# Patient Record
Sex: Female | Born: 1960 | Race: White | Hispanic: No | Smoking: Never smoker
Health system: Southern US, Community
[De-identification: ages and names within clinical notes are randomized; demographics above are authoritative.]

## PROBLEM LIST (undated history)

## (undated) DIAGNOSIS — R011 Cardiac murmur, unspecified: Secondary | ICD-10-CM

## (undated) DIAGNOSIS — H25813 Combined forms of age-related cataract, bilateral: Secondary | ICD-10-CM

## (undated) DIAGNOSIS — H35341 Macular cyst, hole, or pseudohole, right eye: Secondary | ICD-10-CM

## (undated) DIAGNOSIS — N281 Cyst of kidney, acquired: Secondary | ICD-10-CM

## (undated) DIAGNOSIS — C50911 Malignant neoplasm of unspecified site of right female breast: Secondary | ICD-10-CM

## (undated) DIAGNOSIS — E041 Nontoxic single thyroid nodule: Secondary | ICD-10-CM

## (undated) DIAGNOSIS — K219 Gastro-esophageal reflux disease without esophagitis: Secondary | ICD-10-CM

## (undated) DIAGNOSIS — R7989 Other specified abnormal findings of blood chemistry: Secondary | ICD-10-CM

## (undated) DIAGNOSIS — H43813 Vitreous degeneration, bilateral: Secondary | ICD-10-CM

## (undated) DIAGNOSIS — I341 Nonrheumatic mitral (valve) prolapse: Secondary | ICD-10-CM

## (undated) DIAGNOSIS — T8859XA Other complications of anesthesia, initial encounter: Secondary | ICD-10-CM

## (undated) DIAGNOSIS — A6920 Lyme disease, unspecified: Secondary | ICD-10-CM

## (undated) DIAGNOSIS — H35371 Puckering of macula, right eye: Secondary | ICD-10-CM

## (undated) HISTORY — DX: Malignant neoplasm of unspecified site of right female breast: C50.911

## (undated) HISTORY — PX: PARS PLANA VITRECTOMY: SHX2166

## (undated) HISTORY — DX: Gastro-esophageal reflux disease without esophagitis: K21.9

## (undated) HISTORY — PX: ESOPHAGOGASTRODUODENOSCOPY: SHX1529

## (undated) HISTORY — DX: Nonrheumatic mitral (valve) prolapse: I34.1

## (undated) HISTORY — DX: Lyme disease, unspecified: A69.20

## (undated) HISTORY — DX: Other complications of anesthesia, initial encounter: T88.59XA

## (undated) HISTORY — DX: Other specified abnormal findings of blood chemistry: R79.89

## (undated) HISTORY — PX: CATARACT EXTRACTION W/ INTRAOCULAR LENS IMPLANT: SHX1309

## (undated) HISTORY — PX: BREAST SURGERY: SHX581

## (undated) HISTORY — DX: Vitreous degeneration, bilateral: H43.813

## (undated) HISTORY — DX: Puckering of macula, right eye: H35.371

## (undated) HISTORY — PX: BREAST BIOPSY: SHX20

## (undated) HISTORY — DX: Combined forms of age-related cataract, bilateral: H25.813

## (undated) HISTORY — PX: MYOMECTOMY: SHX85

## (undated) HISTORY — DX: Cyst of kidney, acquired: N28.1

## (undated) HISTORY — PX: OTHER SURGICAL HISTORY: SHX169

## (undated) HISTORY — DX: Macular cyst, hole, or pseudohole, right eye: H35.341

## (undated) HISTORY — PX: HYSTEROSCOPY WITH D & C: SHX1775

## (undated) HISTORY — DX: Nontoxic single thyroid nodule: E04.1

## (undated) HISTORY — PX: MEMBRANECTOMY: SHX2029

## (undated) HISTORY — PX: COLPOSCOPY: SHX161

## (undated) HISTORY — DX: Cardiac murmur, unspecified: R01.1

## (undated) HISTORY — PX: BREAST EXCISIONAL BIOPSY: SUR124

## (undated) HISTORY — PX: NECK MASS EXCISION: SHX2079

---

## 2017-11-24 ENCOUNTER — Other Ambulatory Visit: Payer: Self-pay | Admitting: Family Medicine

## 2017-11-24 DIAGNOSIS — R928 Other abnormal and inconclusive findings on diagnostic imaging of breast: Secondary | ICD-10-CM

## 2017-12-05 ENCOUNTER — Ambulatory Visit
Admission: RE | Admit: 2017-12-05 | Discharge: 2017-12-05 | Disposition: A | Discharge status: Home / Self Care | Payer: 59 | Source: Ambulatory Visit | Attending: Family Medicine | Admitting: Family Medicine

## 2017-12-05 DIAGNOSIS — R928 Other abnormal and inconclusive findings on diagnostic imaging of breast: Secondary | ICD-10-CM

## 2017-12-26 HISTORY — PX: BREAST LUMPECTOMY: SHX2

## 2018-01-23 HISTORY — PX: BREAST LUMPECTOMY: SHX2

## 2023-01-04 ENCOUNTER — Inpatient Hospital Stay: Payer: Medicaid Other

## 2023-01-04 ENCOUNTER — Encounter: Payer: Self-pay | Admitting: Oncology

## 2023-01-04 ENCOUNTER — Inpatient Hospital Stay: Payer: Medicaid Other | Attending: Oncology | Admitting: Oncology

## 2023-01-04 VITALS — BP 146/93 | HR 71 | Temp 98.3°F | Resp 14 | Ht 65.0 in | Wt 221.5 lb

## 2023-01-04 DIAGNOSIS — Z803 Family history of malignant neoplasm of breast: Secondary | ICD-10-CM | POA: Insufficient documentation

## 2023-01-04 DIAGNOSIS — K219 Gastro-esophageal reflux disease without esophagitis: Secondary | ICD-10-CM | POA: Diagnosis not present

## 2023-01-04 DIAGNOSIS — Z923 Personal history of irradiation: Secondary | ICD-10-CM

## 2023-01-04 DIAGNOSIS — D0511 Intraductal carcinoma in situ of right breast: Secondary | ICD-10-CM

## 2023-01-04 DIAGNOSIS — Z806 Family history of leukemia: Secondary | ICD-10-CM | POA: Diagnosis not present

## 2023-01-04 DIAGNOSIS — D0510 Intraductal carcinoma in situ of unspecified breast: Secondary | ICD-10-CM | POA: Insufficient documentation

## 2023-01-04 DIAGNOSIS — Z79899 Other long term (current) drug therapy: Secondary | ICD-10-CM | POA: Insufficient documentation

## 2023-01-04 DIAGNOSIS — Z801 Family history of malignant neoplasm of trachea, bronchus and lung: Secondary | ICD-10-CM | POA: Diagnosis not present

## 2023-01-04 DIAGNOSIS — I10 Essential (primary) hypertension: Secondary | ICD-10-CM | POA: Insufficient documentation

## 2023-01-04 DIAGNOSIS — R739 Hyperglycemia, unspecified: Secondary | ICD-10-CM | POA: Insufficient documentation

## 2023-01-04 LAB — CBC WITH DIFFERENTIAL/PLATELET
Abs Immature Granulocytes: 0.03 10*3/uL (ref 0.00–0.07)
Basophils Absolute: 0.1 10*3/uL (ref 0.0–0.1)
Basophils Relative: 1 %
Eosinophils Absolute: 0.1 10*3/uL (ref 0.0–0.5)
Eosinophils Relative: 2 %
HCT: 44.4 % (ref 36.0–46.0)
Hemoglobin: 14.5 g/dL (ref 12.0–15.0)
Immature Granulocytes: 1 %
Lymphocytes Relative: 36 %
Lymphs Abs: 2.1 10*3/uL (ref 0.7–4.0)
MCH: 28.9 pg (ref 26.0–34.0)
MCHC: 32.7 g/dL (ref 30.0–36.0)
MCV: 88.6 fL (ref 80.0–100.0)
Monocytes Absolute: 0.5 10*3/uL (ref 0.1–1.0)
Monocytes Relative: 9 %
Neutro Abs: 3 10*3/uL (ref 1.7–7.7)
Neutrophils Relative %: 51 %
Platelets: 298 10*3/uL (ref 150–400)
RBC: 5.01 MIL/uL (ref 3.87–5.11)
RDW: 14.3 % (ref 11.5–15.5)
WBC: 5.8 10*3/uL (ref 4.0–10.5)
nRBC: 0 % (ref 0.0–0.2)

## 2023-01-04 LAB — COMPREHENSIVE METABOLIC PANEL
ALT: 30 U/L (ref 0–44)
AST: 21 U/L (ref 15–41)
Albumin: 3.7 g/dL (ref 3.5–5.0)
Alkaline Phosphatase: 76 U/L (ref 38–126)
Anion gap: 9 (ref 5–15)
BUN: 14 mg/dL (ref 6–20)
CO2: 25 mmol/L (ref 22–32)
Calcium: 9.4 mg/dL (ref 8.9–10.3)
Chloride: 105 mmol/L (ref 98–111)
Creatinine, Ser: 0.83 mg/dL (ref 0.44–1.00)
GFR, Estimated: >60 mL/min (ref >60)
Glucose, Bld: 90 mg/dL (ref 70–99)
Potassium: 3.8 mmol/L (ref 3.5–5.1)
Sodium: 139 mmol/L (ref 135–145)
Total Bilirubin: 0.4 mg/dL (ref 0.3–1.2)
Total Protein: 7.6 g/dL (ref 6.5–8.1)

## 2023-01-04 NOTE — Progress Notes (Unsigned)
[facility] Cancer Initial Visit:  Patient Care Team: Nodal, Joline Salt, PA-C as PCP - General (Physician Assistant)  CHIEF COMPLAINTS/PURPOSE OF CONSULTATION:  Oncology History  DCIS (ductal carcinoma in situ) of breast  01/04/2023 Initial Diagnosis   DCIS (ductal carcinoma in situ) of breast     HISTORY OF PRESENTING ILLNESS: Susan Ware 62 y.o. female is here because of  DCIS History notable for GERD, hypertension, hyperglycemia, elevated TSH, disease, vitreous detachment both eyes  May 25, 2017: Diagnostic right mammogram with ultrasound to evaluate patient after callback from screening which noted a question mass in right breast.  Deviously question mass persist on additional views appears to have been notched at may represent inframamillary lymph node.  Ultrasound showed no focal abnormality in right breast 7:00 through 11 o'clock position.     November 23, 2017: Diagnostic right mammogram--subtle area of probable distortion in the upper outer right breast.  BI-RADS 4  December 05 2017:  Needle core bx right breast lesion. Pathology - Breast, right, needle core biopsy, UOQ posterior.  COMPLEX SCLEROSING LESION WITH USUAL DUCTAL HYPERPLASIA. - FIBROCYTSTIC CHANGES WITH USUAL DUCTAL HYPERPLASIA.  December 26 2017: Lumpectomy right breast .  Allergy focal DCIS intermediate nuclear grade.  DCIS less than 2 mm from medial margin.  Residual complex sclerosing lesion with usual ductal hyperplasia and columnar cell change at an microcalcification adjacent to previous biopsy site.  Separate 2 mm complex sclerosing lesion  IHC Estrogen positive/Progesterone negative.   January 23 2018:  Re-excision.  Pathology- No evidence of malignancy or atypia  February 20 2018:  Began adjuvant XRT to right breast  April 02 2018:  Began Anastrazole Oct 17 2018:  Discontinued 08/17/2018 due to nausea and emesis.  Started Exemestane 25 mg daily but couldn't tolerate due to nausea/emesis April   2020:  Declined further endocrine therapy. Concerned about side effects.   October 29 2020:  Bilateral Diagnostic Mammogram.  BIRADS 2  January 03 2023:  Gynecology visit for postmenopausal bleeding.  Underwent endometrial bx  January 04 2023:  [facility] Medical Oncology Consult  Reviewed history with patient.  Has not undergone mammogram since June 2022 because she did not have insurance.   Menarche 62 years of age.  Patient G1 P1 with first and only pregnancy at 62 years of age.  Believes that she went into menopausal 15 years ago.  Did not have regular menstrual cycle.    Social:  Divorced.  Has been an HR directory.  Tobacco none.  EtOH none  [facility] Mother alive 47 well Father died 23 AML Brother alive 34 IBS    Review of Systems  Constitutional:  Negative for chills, fatigue and fever.       Has gained 70 lbs over 5 years attributed to thyroid issues  HENT:   Positive for hearing loss. Negative for mouth sores, nosebleeds, sore throat and voice change.        Gets choked easily  Eyes:  Positive for eye problems. Negative for icterus.       Vision changes:  None  Respiratory:  Negative for cough, hemoptysis, shortness of breath and wheezing.        PND:  none Orthopnea:  none DOE:    Cardiovascular:  Negative for leg swelling and palpitations.       Chest tightness sometimes if does things outside  Gastrointestinal:  Negative for blood in stool and vomiting.       Had colitis last year and  has experienced discomfort since then.  Constipation alternating with diarrhea.  Occasional nausea  Endocrine: Positive for hot flashes.       Cold intolerance:  none Heat intolerance:  none  Genitourinary:  Negative for bladder incontinence, difficulty urinating, dysuria, frequency, hematuria and nocturia.   Musculoskeletal:  Positive for back pain. Negative for gait problem, myalgias, neck pain and neck stiffness.       Chronic arthralgias particularly of hips  Skin:  Negative for itching, rash  and wound.  Neurological:  Negative for dizziness, extremity weakness, gait problem, light-headedness, seizures and speech difficulty.       Gets migraines.  Numbness of fingers and toes due to back injuries from horses  Hematological:  Negative for adenopathy. Does not bruise/bleed easily.  Psychiatric/Behavioral:  Negative for sleep disturbance and suicidal ideas. The patient is not nervous/anxious.     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-related cataract of both eyes    Elevated TSH    Epiretinal membrane (ERM) of right eye    GERD (gastroesophageal reflux disease)    Heart murmur    Kidney cysts    Lyme disease    Macular hole, right eye    Malignant neoplasm of right breast in female, estrogen receptor positive (HCC)    Mitral valve prolapse    Posterior vitreous detachment of both eyes    Right thyroid nodule     SURGICAL HISTORY: Past Surgical History:  Procedure Laterality Date   BREAST BIOPSY Right    BREAST EXCISIONAL BIOPSY Left    BREAST LUMPECTOMY Right 12/26/2017   BREAST LUMPECTOMY Right 01/23/2018   BREAST SURGERY     CATARACT EXTRACTION W/ INTRAOCULAR LENS IMPLANT     CESAREAN SECTION     COLPOSCOPY     ENDOLASER     ESOPHAGOGASTRODUODENOSCOPY     MEMBRANECTOMY     MYOMECTOMY     NECK MASS EXCISION     Lymph node   PARS PLANA VITRECTOMY     Right Eye Surgery     YAG Capsulotomy      SOCIAL HISTORY: Social History   Socioeconomic History   Marital status: Divorced    Spouse name: Not on file   Number of children: 1   Years of education: 12+   Highest education level: Not on file  Occupational History   Not on file  Tobacco Use   Smoking status: Never   Smokeless tobacco: Never  Vaping Use   Vaping status: Never Used  Substance and Sexual Activity   Alcohol use: Not Currently   Drug use: Never   Sexual activity: Not on file  Other Topics Concern   Not on file   Social History Narrative   Not on file   Social Determinants of Health   Financial Resource Strain: High Risk (11/29/2021)   [facility] visits prior to 07/30/2022.   Overall Financial Resource Strain (CARDIA)    Difficulty of Paying Living Expenses: Hard  Food Insecurity: Low Risk  (12/14/2022)   Received from [facility]   Food vital sign    Within the past 12 months, you worried that your food would run out before you got money to buy more: Never true    Within the past 12 months, the food you bought just didn't last and you didn't have money to get more. : Never true  Transportation Needs:  Not on file (12/14/2022)  Physical Activity: Sufficiently Active (11/29/2021)   Received [facility], [facility] visits prior to 07/30/2022.   Exercise Vital Sign    Days of Exercise per Week: 7 days    Minutes of Exercise per Session: 120 min  Stress: Stress Concern Present (11/29/2021)   [facility] from [facility], [facility] visits prior to 07/30/2022.   [employer] of Occupational Health - Occupational Stress Questionnaire    Feeling of Stress : To some extent  Social Connections: Moderately Integrated (11/29/2021)   Received from [facility], [facility] visits prior to 07/30/2022.   Social Advertising account executive [NHANES]    Frequency of Communication with Friends and Family: More than three times a week    Frequency of Social Gatherings with Friends and Family: Twice a week    Attends Religious Services: More than 4 times per year    Active Member of [employer] or Organizations: Yes    Attends Engineer, structural: More than 4 times per year    Marital Status: Divorced  Intimate Partner Violence: Not At Risk (01/04/2023)   Humiliation, Afraid, Rape, and Kick questionnaire    Fear of Current or Ex-Partner: No    Emotionally Abused: No    Physically Abused:  No    Sexually Abused: No    FAMILY HISTORY Family History  Problem Relation Age of Onset   Cataracts Mother    Anesthesia problems Mother    Diabetes Father    Leukemia Father    Glaucoma Father    Breast cancer Sister    Lung cancer Maternal Aunt    Breast cancer Maternal Aunt    Breast cancer Maternal Aunt    Breast cancer Paternal Aunt    Macular degeneration Maternal Grandfather    Glaucoma Paternal Grandmother    Retinal detachment Paternal Grandfather     ALLERGIES:  is allergic to ciprofloxacin, methylprednisolone, penicillin g, sulfamethoxazole, tetracycline, yellow jacket venom, codeine, iodine, oxycodone, anastrozole, exemestane, and iohexol.  MEDICATIONS:  Current Outpatient Medications  Medication Sig Dispense Refill   Omega-3 Fatty Acids (FISH OIL) 1000 MG CAPS Take 1 capsule by mouth daily.     Probiotic Product (PROBIOTIC ADVANCED PO) Take by mouth.     Vitamin D-Vitamin K (D3 + K2 PO) Take 1 Capful by mouth daily at 12 noon.     ascorbic acid (VITAMIN C) 1000 MG tablet Take 1,500 mg by mouth daily.     calcium carbonate (SUPER CALCIUM) 1500 (600 Ca) MG TABS tablet Take 1 capsule by mouth daily.     cycloSPORINE (RESTASIS) 0.05 % ophthalmic emulsion 1 drop 2 (two) times daily.     LYRICA 100 MG capsule Take 100 mg by mouth daily. (Patient not taking: Reported on 01/04/2023)     Multiple Vitamin (MULTIVITAMIN) capsule Take 1 capsule by mouth daily.     ondansetron (ZOFRAN-ODT) 4 MG disintegrating tablet Take 4 mg by mouth every 6 (six) hours as needed. (Patient not taking: Reported on 01/04/2023)     No current facility-administered medications for this visit.    PHYSICAL EXAMINATION:  ECOG PERFORMANCE STATUS: 0 - Asymptomatic   Vitals:   01/04/23 1127  BP: (!) 146/93  Pulse: 71  Resp: 14  Temp: 98.3 F (36.8 C)  SpO2: 95%    Filed Weights   01/04/23 1127  Weight: 221 lb 8 oz (100.5 kg)     Physical Exam Vitals  and nursing note reviewed.   Constitutional:      Appearance: Normal appearance. She is not toxic-appearing or diaphoretic.     Comments: Here alone  HENT:     Head: Normocephalic and atraumatic.     Right Ear: External ear normal.     Left Ear: External ear normal.     Nose: Nose normal. No congestion or rhinorrhea.  Eyes:     General: No scleral icterus.    Extraocular Movements: Extraocular movements intact.     Conjunctiva/sclera: Conjunctivae normal.     Pupils: Pupils are equal, round, and reactive to light.  Cardiovascular:     Rate and Rhythm: Normal rate.     Heart sounds: No murmur heard.    No friction rub. No gallop.  Abdominal:     General: Bowel sounds are normal.     Palpations: Abdomen is soft.  Musculoskeletal:        General: No swelling, tenderness or deformity.     Cervical back: Normal range of motion and neck supple. No rigidity or tenderness.  Lymphadenopathy:     Head:     Right side of head: No submental, submandibular, tonsillar, preauricular, posterior auricular or occipital adenopathy.     Left side of head: No submental, submandibular, tonsillar, preauricular, posterior auricular or occipital adenopathy.     Cervical: No cervical adenopathy.     Right cervical: No superficial, deep or posterior cervical adenopathy.    Left cervical: No superficial, deep or posterior cervical adenopathy.     Upper Body:     Right upper body: No supraclavicular, axillary, pectoral or epitrochlear adenopathy.     Left upper body: No supraclavicular, axillary, pectoral or epitrochlear adenopathy.  Skin:    General: Skin is warm.     Coloration: Skin is not jaundiced.  Neurological:     General: No focal deficit present.     Mental Status: She is alert and oriented to person, place, and time. Mental status is at baseline.     Cranial Nerves: No cranial nerve deficit.  Psychiatric:        Mood and Affect: Mood normal.        Behavior: Behavior normal.        Thought Content: Thought content  normal.        Judgment: Judgment normal.      LABORATORY DATA: I have personally reviewed the data as listed:  Appointment on 01/04/2023  Component Date Value Ref Range Status   Sodium 01/04/2023 139  135 - 145 mmol/L Final   Potassium 01/04/2023 3.8  3.5 - 5.1 mmol/L Final   Chloride 01/04/2023 105  98 - 111 mmol/L Final   CO2 01/04/2023 25  22 - 32 mmol/L Final   Glucose, Bld 01/04/2023 90  70 - 99 mg/dL Final   Glucose reference range applies only to samples taken after fasting for at least 8 hours.   BUN 01/04/2023 14  6 - 20 mg/dL Final   Creatinine, Ser 01/04/2023 0.83  0.44 - 1.00 mg/dL Final   Calcium 09/81/1914 9.4  8.9 - 10.3 mg/dL Final   Total Protein 78/29/5621 7.6  6.5 - 8.1 g/dL Final   Albumin 30/86/5784 3.7  3.5 - 5.0 g/dL Final   AST 69/62/9528 21  15 - 41 U/L Final   ALT 01/04/2023 30  0 - 44 U/L Final   Alkaline Phosphatase 01/04/2023 76  38 - 126 U/L Final   Total Bilirubin 01/04/2023 0.4  0.3 -  1.2 mg/dL Final   GFR, Estimated 01/04/2023 >60  >60 mL/min Final   Comment: (NOTE) Calculated using the CKD-EPI Creatinine Equation (2021)    Anion gap 01/04/2023 9  5 - 15 Final   Performed at [facility], [address], [place], [place] [address]   WBC 01/04/2023 5.8  4.0 - 10.5 K/uL Final   RBC 01/04/2023 5.01  3.87 - 5.11 MIL/uL Final   Hemoglobin 01/04/2023 14.5  12.0 - 15.0 g/dL Final   HCT 19/14/7829 44.4  36.0 - 46.0 % Final   MCV 01/04/2023 88.6  80.0 - 100.0 fL Final   MCH 01/04/2023 28.9  26.0 - 34.0 pg Final   MCHC 01/04/2023 32.7  30.0 - 36.0 g/dL Final   RDW 56/21/3086 14.3  11.5 - 15.5 % Final   Platelets 01/04/2023 298  150 - 400 K/uL Final   nRBC 01/04/2023 0.0  0.0 - 0.2 % Final   Neutrophils Relative % 01/04/2023 51  % Final   Neutro Abs 01/04/2023 3.0  1.7 - 7.7 K/uL Final   Lymphocytes Relative 01/04/2023 36  % Final   Lymphs Abs 01/04/2023 2.1  0.7 - 4.0 K/uL Final   Monocytes Relative 01/04/2023 9  % Final    Monocytes Absolute 01/04/2023 0.5  0.1 - 1.0 K/uL Final   Eosinophils Relative 01/04/2023 2  % Final   Eosinophils Absolute 01/04/2023 0.1  0.0 - 0.5 K/uL Final   Basophils Relative 01/04/2023 1  % Final   Basophils Absolute 01/04/2023 0.1  0.0 - 0.1 K/uL Final   Immature Granulocytes 01/04/2023 1  % Final   Abs Immature Granulocytes 01/04/2023 0.03  0.00 - 0.07 K/uL Final   Performed at [facility], [address], [place], [place] [address]    RADIOGRAPHIC STUDIES: I have personally reviewed the radiological images as listed and agree with the findings in the report  No results found.  ASSESSMENT/PLAN  62 y.o. female is here because of  DCIS.  History notable for GERD, hypertension, hyperglycemia, elevated TSH, disease, vitreous detachment both eyes   DCIS, right breast.  Grade 2 (ER positive, PR positive) May 25, 2017: Diagnostic right mammogram with U/S after called back  from screening.   Previously questioned mass in right breast persist on additional views U/S showed no focal abnormality in right breast 7:00 through 11 o'clock position.       November 23, 2017: Diagnostic right mammogram--subtle area of probable distortion in the upper outer right breast.  BI-RADS 4    December 05 2017:  Needle core bx right breast lesion. Pathology - COMPLEX SCLEROSING LESION WITH USUAL DUCTAL HYPERPLASIA. FIBROCYTSTIC CHANGES WITH USUAL DUCTAL HYPERPLASIA.   December 26 2017: Lumpectomy right breast .  Focal DCIS intermediate nuclear grade.  DCIS < 2 mm from medial margin.  Residual complex sclerosing lesion with usual ductal hyperplasia and columnar cell change at an microcalcification adjacent to previous biopsy site.  Separate 2 mm complex sclerosing lesion  IHC Estrogen positive/Progesterone negative.    January 23 2018:  Re-excision.  Pathology- No evidence of malignancy or atypia   February 20 2018:  Began adjuvant XRT to right breast    April 02 2018:  Began  Anastrazole Oct 17 2018:  Discontinued 08/17/2018 due to nausea and emesis.  Started Exemestane 25 mg daily but couldn't tolerate due to nausea/emesis April  2020:  Declined further endocrine therapy. Concerned about side effects.    October 29 2020:  Bilateral Diagnostic Mammogram.  BIRADS 2  [employer]  January 04 2023- Refer for screening mammogram.  Patient did not undergo mammogram in 2023 due to loss of insurance   Cancer Staging  No matching staging information was found for the patient.    No problem-specific Assessment & Plan notes found for this encounter.    Orders Placed This Encounter  Procedures   MM DIAG BREAST TOMO BILATERAL    Standing Status:   Future    Standing Expiration Date:   01/04/2024    Order Specific Question:   Reason for Exam (SYMPTOM  OR DIAGNOSIS REQUIRED)    Answer:   DCIS    Order Specific Question:   Is the patient pregnant?    Answer:   No    Order Specific Question:   Preferred imaging location?    Answer:   External   CBC with Differential/Platelet    Standing Status:   Future    Number of Occurrences:   1    Standing Expiration Date:   01/04/2024   Comprehensive metabolic panel    Standing Status:   Future    Number of Occurrences:   1    Standing Expiration Date:   01/04/2024    62  minutes was spent in patient care.  This included time spent preparing to see the patient (e.g., review of tests), obtaining and/or reviewing separately obtained history, counseling and educating the patient/family/caregiver, ordering medications, tests, or procedures; documenting clinical information in the electronic or other health record, independently interpreting results and communicating results to the patient/family/caregiver as well as coordination of care.       All questions were answered. The patient knows to call the clinic with any problems, questions or concerns.  This note was electronically signed.    Loni Muse, MD  01/12/2023 11:55 AM

## 2023-01-12 DIAGNOSIS — Z923 Personal history of irradiation: Secondary | ICD-10-CM | POA: Insufficient documentation

## 2023-03-13 ENCOUNTER — Telehealth: Payer: Self-pay

## 2023-03-13 NOTE — Telephone Encounter (Signed)
Please review patient's chart, she is going to be having a Hysteroscopy D&C. Her GYN is wanting to place a [place] IUD as well. Please advise if she is okay to proceed.   [phone](Tiffany)

## 2023-03-14 NOTE — Telephone Encounter (Signed)
Called and spoke with [place]. I have advised that the patient can not use a hormonal IUD. ParaGard should be okay, if patient if okay with this option.    Loni Muse, MD  You21 hours ago (5:05 PM)   ER Patient has recent history of DCIS therefore placement of a hormone eluting IUD is contraindicated

## 2023-04-06 ENCOUNTER — Encounter: Payer: Self-pay | Admitting: Oncology

## 2023-04-06 ENCOUNTER — Telehealth: Payer: Self-pay | Admitting: Oncology

## 2023-04-06 ENCOUNTER — Inpatient Hospital Stay: Payer: Medicaid Other | Attending: Oncology | Admitting: Oncology

## 2023-04-06 VITALS — BP 133/79 | HR 82 | Temp 98.3°F | Resp 14 | Ht 65.0 in | Wt 218.0 lb

## 2023-04-06 DIAGNOSIS — N939 Abnormal uterine and vaginal bleeding, unspecified: Secondary | ICD-10-CM | POA: Diagnosis not present

## 2023-04-06 DIAGNOSIS — D0510 Intraductal carcinoma in situ of unspecified breast: Secondary | ICD-10-CM

## 2023-04-06 DIAGNOSIS — Z803 Family history of malignant neoplasm of breast: Secondary | ICD-10-CM | POA: Diagnosis not present

## 2023-04-06 DIAGNOSIS — Z86000 Personal history of in-situ neoplasm of breast: Secondary | ICD-10-CM | POA: Diagnosis present

## 2023-04-06 DIAGNOSIS — Z79899 Other long term (current) drug therapy: Secondary | ICD-10-CM | POA: Insufficient documentation

## 2023-04-06 DIAGNOSIS — Z780 Asymptomatic menopausal state: Secondary | ICD-10-CM

## 2023-04-06 DIAGNOSIS — Z923 Personal history of irradiation: Secondary | ICD-10-CM | POA: Insufficient documentation

## 2023-04-06 DIAGNOSIS — Z9189 Other specified personal risk factors, not elsewhere classified: Secondary | ICD-10-CM | POA: Diagnosis not present

## 2023-04-06 DIAGNOSIS — Z801 Family history of malignant neoplasm of trachea, bronchus and lung: Secondary | ICD-10-CM | POA: Insufficient documentation

## 2023-04-06 DIAGNOSIS — M8580 Other specified disorders of bone density and structure, unspecified site: Secondary | ICD-10-CM

## 2023-04-06 NOTE — Progress Notes (Signed)
[facility] Cancer Initial Visit:  Patient Care Team: Nodal, Joline Salt, PA-C as PCP - General (Physician Assistant)  CHIEF COMPLAINTS/PURPOSE OF CONSULTATION:  Oncology History  DCIS (ductal carcinoma in situ) of breast  01/04/2023 Initial Diagnosis   DCIS (ductal carcinoma in situ) of breast     HISTORY OF PRESENTING ILLNESS: Susan Ware 62 y.o. female is here because of  DCIS History notable for GERD, hypertension, hyperglycemia, elevated TSH, disease, vitreous detachment both eyes  May 25, 2017: Diagnostic right mammogram with ultrasound to evaluate patient after callback from screening which noted a question mass in right breast.  Deviously question mass persist on additional views appears to have been notched at may represent inframamillary lymph node.  Ultrasound showed no focal abnormality in right breast 7:00 through 11 o'clock position.     November 23, 2017: Diagnostic right mammogram--subtle area of probable distortion in the upper outer right breast.  BI-RADS 4  December 05 2017:  Needle core bx right breast lesion. Pathology - Breast, right, needle core biopsy, UOQ posterior.  COMPLEX SCLEROSING LESION WITH USUAL DUCTAL HYPERPLASIA. - FIBROCYTSTIC CHANGES WITH USUAL DUCTAL HYPERPLASIA.  December 26 2017: Lumpectomy right breast .  Allergy focal DCIS intermediate nuclear grade.  DCIS less than 2 mm from medial margin.  Residual complex sclerosing lesion with usual ductal hyperplasia and columnar cell change at an microcalcification adjacent to previous biopsy site.  Separate 2 mm complex sclerosing lesion  IHC Estrogen positive/Progesterone negative.   January 23 2018:  Re-excision.  Pathology- No evidence of malignancy or atypia  February 20 2018:  Began adjuvant XRT to right breast  April 02 2018:  Began Anastrazole Oct 17 2018:  Discontinued 08/17/2018 due to nausea and emesis.  Started Exemestane 25 mg daily but couldn't tolerate due to nausea/emesis April   2020:  Declined further endocrine therapy. Concerned about side effects.   October 29 2020:  Bilateral Diagnostic Mammogram.  BIRADS 2  January 03 2023:  Gynecology visit for postmenopausal bleeding.  Underwent endometrial bx  January 04 2023:  [facility] Medical Oncology Consult  Reviewed history with patient.  Has not undergone mammogram since June 2022 because she did not have insurance.   Menarche 62 years of age.  Patient G1 P1 with first and only pregnancy at 62 years of age.  Believes that she went into menopausal 15 years ago.  Did not have regular menstrual cycle.    Social:  Divorced.  Has been an HR directory.  Tobacco none.  EtOH none  [facility] Mother alive 66 well Father died 44 AML Brother alive 75 IBS   January 12 2023:  Bilateral screening mammogram BI-RADS CATEGORY  1: Negative.   March 22 2023:  [place] as follows: 9 x 3 x 3 cm with an endometrial thickness of 10 mm with mild cystic changes. EMB w/ proliferative endometrium no hyperplasia or malignancy.  Performed to evaluate abnormal vaginal bleeding.      April 06 2023:  Scheduled follow up regarding DCIS.  Reviewed results of mammogram and U/S with patient.  Fatigued.  Struggling with neuropathic pain in right leg.   Has never undergone a bone density scan.  Will refer for DEXA   Review of Systems  Constitutional:  Negative for chills, fatigue and fever.       Has lost 3 lbs since last visit.    HENT:   Positive for hearing loss. Negative for mouth sores, nosebleeds, sore throat and voice change.  Gets choked easily  Eyes:  Positive for eye problems. Negative for icterus.       Vision changes:  None  Respiratory:  Negative for cough, hemoptysis, shortness of breath and wheezing.        PND:  none Orthopnea:  none DOE:    Cardiovascular:  Negative for leg swelling and palpitations.       Chest tightness sometimes if does things outside  Gastrointestinal:  Negative for blood in stool and vomiting.       Had  colitis last year and has experienced discomfort since then.  Constipation alternating with diarrhea.  Occasional nausea  Endocrine: Positive for hot flashes.       Cold intolerance:  none Heat intolerance:  none  Genitourinary:  Negative for bladder incontinence, difficulty urinating, dysuria, frequency, hematuria and nocturia.   Musculoskeletal:  Positive for back pain. Negative for gait problem, myalgias, neck pain and neck stiffness.       Chronic arthralgias particularly of hips  Skin:  Negative for itching, rash and wound.  Neurological:  Negative for dizziness, extremity weakness, gait problem, light-headedness, seizures and speech difficulty.       Gets migraines.  Numbness of fingers and toes due to back injuries from horses  Hematological:  Negative for adenopathy. Does not bruise/bleed easily.  Psychiatric/Behavioral:  Negative for sleep disturbance and suicidal ideas. The patient is not nervous/anxious.     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-related cataract of both eyes    Elevated TSH    Epiretinal membrane (ERM) of right eye    GERD (gastroesophageal reflux disease)    Heart murmur    Kidney cysts    Lyme disease    Macular hole, right eye    Malignant neoplasm of right breast in female, estrogen receptor positive (HCC)    Mitral valve prolapse    Posterior vitreous detachment of both eyes    Right thyroid nodule     SURGICAL HISTORY: Past Surgical History:  Procedure Laterality Date   BREAST BIOPSY Right    BREAST EXCISIONAL BIOPSY Left    BREAST LUMPECTOMY Right 12/26/2017   BREAST LUMPECTOMY Right 01/23/2018   BREAST SURGERY     CATARACT EXTRACTION W/ INTRAOCULAR LENS IMPLANT     CESAREAN SECTION     COLPOSCOPY     ENDOLASER     ESOPHAGOGASTRODUODENOSCOPY     HYSTEROSCOPY WITH D & C     MEMBRANECTOMY     MYOMECTOMY     NECK MASS EXCISION     Lymph node   PARS PLANA VITRECTOMY      Right Eye Surgery     YAG Capsulotomy      SOCIAL HISTORY: Social History   Socioeconomic History   Marital status: Divorced    Spouse name: Not on file   Number of children: 1   Years of education: 12+   Highest education level: Not on file  Occupational History   Not on file  Tobacco Use   Smoking status: Never   Smokeless tobacco: Never  Vaping Use   Vaping status: Never Used  Substance and Sexual Activity   Alcohol use: Not Currently   Drug use: Never   Sexual activity: Not on file  Other Topics Concern   Not on file  Social History Narrative   Not on file   Social Determinants of Health   Financial Resource Strain: High Risk (11/29/2021)  Received from [facility], [facility] visits prior to 07/30/2022.   Overall Financial Resource Strain (CARDIA)    Difficulty of Paying Living Expenses: Hard  Food Insecurity: Low Risk  (02/17/2023)   Received from [facility]   Hunger Vital Sign    Worried About Running Out of Food in the Last Year: Never true    Ran Out of Food in the Last Year: Never true  Transportation Needs: No Transportation Needs (02/17/2023)   Received from [employer]    In the past 12 months, has lack of reliable transportation kept you from medical appointments, meetings, work or from getting things needed for daily living? : No  Physical Activity: Sufficiently Active (11/29/2021)   Received from [facility], [facility] visits prior to 07/30/2022.   Exercise Vital Sign    Days of Exercise per Week: 7 days    Minutes of Exercise per Session: 120 min  Stress: Stress Concern Present (11/29/2021)   Received from [facility], [facility] visits prior to 07/30/2022.   [employer] of Occupational Health - Occupational Stress Questionnaire    Feeling of Stress : To some extent  Social Connections: Moderately Integrated (11/29/2021)   Received from [facility],  [facility] visits prior to 07/30/2022.   Social Advertising account executive [NHANES]    Frequency of Communication with Friends and Family: More than three times a week    Frequency of Social Gatherings with Friends and Family: Twice a week    Attends Religious Services: More than 4 times per year    Active Member of [employer] or Organizations: Yes    Attends Engineer, structural: More than 4 times per year    Marital Status: Divorced  Intimate Partner Violence: Not At Risk (01/04/2023)   Humiliation, Afraid, Rape, and Kick questionnaire    Fear of Current or Ex-Partner: No    Emotionally Abused: No    Physically Abused: No    Sexually Abused: No    FAMILY HISTORY Family History  Problem Relation Age of Onset   Cataracts Mother    Anesthesia problems Mother    Diabetes Father    Leukemia Father    Glaucoma Father    Breast cancer Sister    Lung cancer Maternal Aunt    Breast cancer Maternal Aunt    Breast cancer Maternal Aunt    Breast cancer Paternal Aunt    Macular degeneration Maternal Grandfather    Glaucoma Paternal Grandmother    Retinal detachment Paternal Grandfather     ALLERGIES:  is allergic to ciprofloxacin, methylprednisolone, penicillin g, sulfamethoxazole, tetracycline, yellow jacket venom, codeine, iodine, oxycodone, anastrozole, exemestane, and iohexol.  MEDICATIONS:  Current Outpatient Medications  Medication Sig Dispense Refill   ibuprofen (ADVIL) 800 MG tablet Take 800 mg by mouth every 8 (eight) hours as needed.     ascorbic acid (VITAMIN C) 1000 MG tablet Take 1,500 mg by mouth daily. (Patient not taking: Reported on 04/06/2023)     calcium carbonate (SUPER CALCIUM) 1500 (600 Ca) MG TABS tablet Take 1 capsule by mouth daily. (Patient not taking: Reported on 04/06/2023)     cycloSPORINE (RESTASIS) 0.05 % ophthalmic emulsion 1 drop 2 (two) times daily. (Patient not taking: Reported on 04/06/2023)     LYRICA 100 MG capsule Take 100  mg by mouth daily. (Patient not taking: Reported on 01/04/2023)     Multiple Vitamin (MULTIVITAMIN) capsule  Take 1 capsule by mouth daily. (Patient not taking: Reported on 04/06/2023)     Omega-3 Fatty Acids (FISH OIL) 1000 MG CAPS Take 1 capsule by mouth daily. (Patient not taking: Reported on 04/06/2023)     ondansetron (ZOFRAN-ODT) 4 MG disintegrating tablet Take 4 mg by mouth every 6 (six) hours as needed. (Patient not taking: Reported on 01/04/2023)     Probiotic Product (PROBIOTIC ADVANCED PO) Take by mouth. (Patient not taking: Reported on 04/06/2023)     Vitamin D-Vitamin K (D3 + K2 PO) Take 1 Capful by mouth daily at 12 noon. (Patient not taking: Reported on 04/06/2023)     No current facility-administered medications for this visit.    PHYSICAL EXAMINATION:  ECOG PERFORMANCE STATUS: 0 - Asymptomatic   Vitals:   04/06/23 0925  BP: 133/79  Pulse: 82  Resp: 14  Temp: 98.3 F (36.8 C)  SpO2: 98%     Filed Weights   04/06/23 0925  Weight: 218 lb (98.9 kg)      Physical Exam Vitals and nursing note reviewed.  Constitutional:      Appearance: Normal appearance. She is not toxic-appearing or diaphoretic.     Comments: Here alone  HENT:     Head: Normocephalic and atraumatic.     Right Ear: External ear normal.     Left Ear: External ear normal.     Nose: Nose normal. No congestion or rhinorrhea.  Eyes:     General: No scleral icterus.    Extraocular Movements: Extraocular movements intact.     Conjunctiva/sclera: Conjunctivae normal.     Pupils: Pupils are equal, round, and reactive to light.  Cardiovascular:     Rate and Rhythm: Normal rate.     Heart sounds: No murmur heard.    No friction rub. No gallop.  Abdominal:     General: Bowel sounds are normal.     Palpations: Abdomen is soft.  Musculoskeletal:        General: No swelling, tenderness or deformity.     Cervical back: Normal range of motion and neck supple. No rigidity or tenderness.  Lymphadenopathy:      Head:     Right side of head: No submental, submandibular, tonsillar, preauricular, posterior auricular or occipital adenopathy.     Left side of head: No submental, submandibular, tonsillar, preauricular, posterior auricular or occipital adenopathy.     Cervical: No cervical adenopathy.     Right cervical: No superficial, deep or posterior cervical adenopathy.    Left cervical: No superficial, deep or posterior cervical adenopathy.     Upper Body:     Right upper body: No supraclavicular, axillary, pectoral or epitrochlear adenopathy.     Left upper body: No supraclavicular, axillary, pectoral or epitrochlear adenopathy.  Skin:    General: Skin is warm.     Coloration: Skin is not jaundiced.  Neurological:     General: No focal deficit present.     Mental Status: She is alert and oriented to person, place, and time. Mental status is at baseline.     Cranial Nerves: No cranial nerve deficit.  Psychiatric:        Mood and Affect: Mood normal.        Behavior: Behavior normal.        Thought Content: Thought content normal.        Judgment: Judgment normal.      LABORATORY DATA: I have personally reviewed the data as listed:  No visits with results within  1 Month(s) from this visit.  Latest known visit with results is:  Appointment on 01/04/2023  Component Date Value Ref Range Status   Sodium 01/04/2023 139  135 - 145 mmol/L Final   Potassium 01/04/2023 3.8  3.5 - 5.1 mmol/L Final   Chloride 01/04/2023 105  98 - 111 mmol/L Final   CO2 01/04/2023 25  22 - 32 mmol/L Final   Glucose, Bld 01/04/2023 90  70 - 99 mg/dL Final   Glucose reference range applies only to samples taken after fasting for at least 8 hours.   BUN 01/04/2023 14  6 - 20 mg/dL Final   Creatinine, Ser 01/04/2023 0.83  0.44 - 1.00 mg/dL Final   Calcium 16/02/9603 9.4  8.9 - 10.3 mg/dL Final   Total Protein 54/01/8118 7.6  6.5 - 8.1 g/dL Final   Albumin 14/78/2956 3.7  3.5 - 5.0 g/dL Final   AST 21/30/8657 21   15 - 41 U/L Final   ALT 01/04/2023 30  0 - 44 U/L Final   Alkaline Phosphatase 01/04/2023 76  38 - 126 U/L Final   Total Bilirubin 01/04/2023 0.4  0.3 - 1.2 mg/dL Final   GFR, Estimated 01/04/2023 >60  >60 mL/min Final   Comment: (NOTE) Calculated using the CKD-EPI Creatinine Equation (2021)    Anion gap 01/04/2023 9  5 - 15 Final   Performed at [facility], [address] [place], [place] [address]   WBC 01/04/2023 5.8  4.0 - 10.5 K/uL Final   RBC 01/04/2023 5.01  3.87 - 5.11 MIL/uL Final   Hemoglobin 01/04/2023 14.5  12.0 - 15.0 g/dL Final   HCT 29/52/8413 44.4  36.0 - 46.0 % Final   MCV 01/04/2023 88.6  80.0 - 100.0 fL Final   MCH 01/04/2023 28.9  26.0 - 34.0 pg Final   MCHC 01/04/2023 32.7  30.0 - 36.0 g/dL Final   RDW 24/40/1027 14.3  11.5 - 15.5 % Final   Platelets 01/04/2023 298  150 - 400 K/uL Final   nRBC 01/04/2023 0.0  0.0 - 0.2 % Final   Neutrophils Relative % 01/04/2023 51  % Final   Neutro Abs 01/04/2023 3.0  1.7 - 7.7 K/uL Final   Lymphocytes Relative 01/04/2023 36  % Final   Lymphs Abs 01/04/2023 2.1  0.7 - 4.0 K/uL Final   Monocytes Relative 01/04/2023 9  % Final   Monocytes Absolute 01/04/2023 0.5  0.1 - 1.0 K/uL Final   Eosinophils Relative 01/04/2023 2  % Final   Eosinophils Absolute 01/04/2023 0.1  0.0 - 0.5 K/uL Final   Basophils Relative 01/04/2023 1  % Final   Basophils Absolute 01/04/2023 0.1  0.0 - 0.1 K/uL Final   Immature Granulocytes 01/04/2023 1  % Final   Abs Immature Granulocytes 01/04/2023 0.03  0.00 - 0.07 K/uL Final   Performed at [facility], [address], [place], [place] [address]    RADIOGRAPHIC STUDIES: I have personally reviewed the radiological images as listed and agree with the findings in the report  No results found.  ASSESSMENT/PLAN  62 y.o. female is here because of  DCIS.  History notable for GERD, hypertension, hyperglycemia, elevated TSH, disease, vitreous detachment both  eyes   DCIS, right breast.  Grade 2 (ER positive, PR positive) May 25, 2017: Diagnostic right mammogram with U/S after called back  from screening.   Previously questioned mass in right breast persist on additional views U/S showed no focal abnormality in right breast  7:00 through 11 o'clock position.       November 23, 2017: Diagnostic right mammogram--subtle area of probable distortion in the upper outer right breast.  BI-RADS 4    December 05 2017:  Needle core bx right breast lesion. Pathology - COMPLEX SCLEROSING LESION WITH USUAL DUCTAL HYPERPLASIA. FIBROCYTSTIC CHANGES WITH USUAL DUCTAL HYPERPLASIA.   December 26 2017: Lumpectomy right breast .  Focal DCIS intermediate nuclear grade.  DCIS < 2 mm from medial margin.  Residual complex sclerosing lesion with usual ductal hyperplasia and columnar cell change at an microcalcification adjacent to previous biopsy site.  Separate 2 mm complex sclerosing lesion  IHC Estrogen positive/Progesterone negative.    January 23 2018:  Re-excision.  Pathology- No evidence of malignancy or atypia   February 20 2018:  Began adjuvant XRT to right breast    April 02 2018:  Began Anastrazole Oct 17 2018:  Discontinued 08/17/2018 due to nausea and emesis.  Started Exemestane 25 mg daily but couldn't tolerate due to nausea/emesis April  2020:  Declined further endocrine therapy. Concerned about side effects.    October 29 2020:  Bilateral Diagnostic Mammogram.  BIRADS 2  [employer]  January 04 2023- Referred for screening mammogram.  Patient did not undergo mammogram in 2023 due to loss of insurance  January 12 2023:  Bilateral screening mammogram BI-RADS CATEGORY  1: Negative.  April 06 2023- Refer for DEXA as she is at risk for osteoporosis  Abnormal vaginal bleeding   March 22 2023:  [place] as follows: 9 x 3 x 3 cm with an endometrial thickness of 10 mm with mild cystic changes. EMB w/ proliferative endometrium no hyperplasia or malignancy.        Cancer  Staging  No matching staging information was found for the patient.    No problem-specific Assessment & Plan notes found for this encounter.    No orders of the defined types were placed in this encounter.   30  minutes was spent in patient care.  This included time spent preparing to see the patient (e.g., review of tests), obtaining and/or reviewing separately obtained history, counseling and educating the patient/family/caregiver, ordering medications, tests, or procedures; documenting clinical information in the electronic or other health record, independently interpreting results and communicating results to the patient/family/caregiver as well as coordination of care.       All questions were answered. The patient knows to call the clinic with any problems, questions or concerns.  This note was electronically signed.    Loni Muse, MD  04/06/2023 9:29 AM

## 2023-04-06 NOTE — Telephone Encounter (Signed)
Patient has been scheduled for follow-up visit per 04/06/23 LOS.  Pt given an appt calendar with date and time.

## 2023-04-21 DIAGNOSIS — Z9189 Other specified personal risk factors, not elsewhere classified: Secondary | ICD-10-CM | POA: Insufficient documentation

## 2023-04-21 DIAGNOSIS — N939 Abnormal uterine and vaginal bleeding, unspecified: Secondary | ICD-10-CM | POA: Insufficient documentation

## 2023-08-03 NOTE — Progress Notes (Signed)
 [facility] Cancer Initial Visit:  Patient Care Team: Nodal, Joline Salt, PA-C as PCP - General (Physician Assistant)  CHIEF COMPLAINTS/PURPOSE OF CONSULTATION:  Oncology History  DCIS (ductal carcinoma in situ) of breast  01/04/2023 Initial Diagnosis   DCIS (ductal carcinoma in situ) of breast     HISTORY OF PRESENTING ILLNESS: Susan Ware 63 y.o. female is here because of  DCIS History notable for GERD, hypertension, hyperglycemia, elevated TSH, disease, vitreous detachment both eyes  May 25, 2017: Diagnostic right mammogram with ultrasound to evaluate patient after callback from screening which noted a question mass in right breast.  Deviously question mass persist on additional views appears to have been notched at may represent inframamillary lymph node.  Ultrasound showed no focal abnormality in right breast 7:00 through 11 o'clock position.     November 23, 2017: Diagnostic right mammogram--subtle area of probable distortion in the upper outer right breast.  BI-RADS 4  December 05 2017:  Needle core bx right breast lesion. Pathology - Breast, right, needle core biopsy, UOQ posterior.  COMPLEX SCLEROSING LESION WITH USUAL DUCTAL HYPERPLASIA. - FIBROCYTSTIC CHANGES WITH USUAL DUCTAL HYPERPLASIA.  December 26 2017: Lumpectomy right breast .  Allergy focal DCIS intermediate nuclear grade.  DCIS less than 2 mm from medial margin.  Residual complex sclerosing lesion with usual ductal hyperplasia and columnar cell change at an microcalcification adjacent to previous biopsy site.  Separate 2 mm complex sclerosing lesion  IHC Estrogen positive/Progesterone negative.   January 23 2018:  Re-excision.  Pathology- No evidence of malignancy or atypia  February 20 2018:  Began adjuvant XRT to right breast  April 02 2018:  Began Anastrazole Oct 17 2018:  Discontinued 08/17/2018 due to nausea and emesis.  Started Exemestane 25 mg daily but couldn't tolerate due to nausea/emesis April   2020:  Declined further endocrine therapy. Concerned about side effects.   October 29 2020:  Bilateral Diagnostic Mammogram.  BIRADS 2  January 03 2023:  Gynecology visit for postmenopausal bleeding.  Underwent endometrial bx  January 04 2023:  [facility] Medical Oncology Consult  Reviewed history with patient.  Has not undergone mammogram since June 2022 because she did not have insurance.   Menarche 63 years of age.  Patient G1 P1 with first and only pregnancy at 63 years of age.  Believes that she went into menopausal 15 years ago.  Did not have regular menstrual cycle.    Social:  Divorced.  Has been an HR directory.  Tobacco none.  EtOH none  [facility] Mother alive 59 well Father died 43 AML Brother alive 39 IBS   January 12 2023:  Bilateral screening mammogram BI-RADS CATEGORY  1: Negative.   March 22 2023:  [place] as follows: 9 x 3 x 3 cm with an endometrial thickness of 10 mm with mild cystic changes. EMB w/ proliferative endometrium no hyperplasia or malignancy.  Performed to evaluate abnormal vaginal bleeding.      April 06 2023:  Scheduled follow up regarding DCIS.  Reviewed results of mammogram and U/S with patient.  Fatigued.  Struggling with neuropathic pain in right leg.   Has never undergone a bone density scan.  Will refer for DEXA   Review of Systems  Constitutional:  Negative for chills, fatigue and fever.       Has lost 3 lbs since last visit.    HENT:   Positive for hearing loss. Negative for mouth sores, nosebleeds, sore throat and voice change.  Gets choked easily  Eyes:  Positive for eye problems. Negative for icterus.       Vision changes:  None  Respiratory:  Negative for cough, hemoptysis, shortness of breath and wheezing.        PND:  none Orthopnea:  none DOE:    Cardiovascular:  Negative for leg swelling and palpitations.       Chest tightness sometimes if does things outside  Gastrointestinal:  Negative for blood in stool and vomiting.       Had  colitis last year and has experienced discomfort since then.  Constipation alternating with diarrhea.  Occasional nausea  Endocrine: Positive for hot flashes.       Cold intolerance:  none Heat intolerance:  none  Genitourinary:  Negative for bladder incontinence, difficulty urinating, dysuria, frequency, hematuria and nocturia.   Musculoskeletal:  Positive for back pain. Negative for gait problem, myalgias, neck pain and neck stiffness.       Chronic arthralgias particularly of hips  Skin:  Negative for itching, rash and wound.  Neurological:  Negative for dizziness, extremity weakness, gait problem, light-headedness, seizures and speech difficulty.       Gets migraines.  Numbness of fingers and toes due to back injuries from horses  Hematological:  Negative for adenopathy. Does not bruise/bleed easily.  Psychiatric/Behavioral:  Negative for sleep disturbance and suicidal ideas. The patient is not nervous/anxious.     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-related cataract of both eyes    Elevated TSH    Epiretinal membrane (ERM) of right eye    GERD (gastroesophageal reflux disease)    Heart murmur    Kidney cysts    Lyme disease    Macular hole, right eye    Malignant neoplasm of right breast in female, estrogen receptor positive (HCC)    Mitral valve prolapse    Posterior vitreous detachment of both eyes    Right thyroid nodule     SURGICAL HISTORY: Past Surgical History:  Procedure Laterality Date   BREAST BIOPSY Right    BREAST EXCISIONAL BIOPSY Left    BREAST LUMPECTOMY Right 12/26/2017   BREAST LUMPECTOMY Right 01/23/2018   BREAST SURGERY     CATARACT EXTRACTION W/ INTRAOCULAR LENS IMPLANT     CESAREAN SECTION     COLPOSCOPY     ENDOLASER     ESOPHAGOGASTRODUODENOSCOPY     HYSTEROSCOPY WITH D & C     MEMBRANECTOMY     MYOMECTOMY     NECK MASS EXCISION     Lymph node   PARS PLANA VITRECTOMY      Right Eye Surgery     YAG Capsulotomy      SOCIAL HISTORY: Social History   Socioeconomic History   Marital status: Divorced    Spouse name: Not on file   Number of children: 1   Years of education: 12+   Highest education level: Not on file  Occupational History   Not on file  Tobacco Use   Smoking status: Never   Smokeless tobacco: Never  Vaping Use   Vaping status: Never Used  Substance and Sexual Activity   Alcohol use: Not Currently   Drug use: Never   Sexual activity: Not on file  Other Topics Concern   Not on file  Social History Narrative   Not on file   Social Drivers of Health   Financial Resource Strain: High Risk (11/29/2021)  Received from [employer] [facility] [employer], [facility] visits prior to 07/30/2022.   Overall Financial Resource Strain (CARDIA)    Difficulty of Paying Living Expenses: Hard  Food Insecurity: Low Risk  (04/07/2023)   Received from [facility]   Hunger Vital Sign    Worried About Running Out of Food in the Last Year: Never true    Ran Out of Food in the Last Year: Never true  Transportation Needs: No Transportation Needs (04/07/2023)   Received from [employer]    In the past 12 months, has lack of reliable transportation kept you from medical appointments, meetings, work or from getting things needed for daily living? : No  Physical Activity: Sufficiently Active (11/29/2021)   Received from [facility], [facility] visits prior to 07/30/2022.   Exercise Vital Sign    Days of Exercise per Week: 7 days    Minutes of Exercise per Session: 120 min  Stress: Stress Concern Present (11/29/2021)   Received from [facility], [facility] visits prior to 07/30/2022.   [employer] of Occupational Health - Occupational Stress Questionnaire    Feeling of Stress : To some extent  Social Connections: Moderately Integrated (11/29/2021)   [facility], [facility] visits prior to 07/30/2022.   Social Advertising account executive [NHANES]    Frequency of Communication with Friends and Family: More than three times a week    Frequency of Social Gatherings with Friends and Family: Twice a week    Attends Religious Services: More than 4 times per year    Active Member of [employer] or Organizations: Yes    Attends Engineer, structural: More than 4 times per year    Marital Status: Divorced  Intimate Partner Violence: Not At Risk (01/04/2023)   Humiliation, Afraid, Rape, and Kick questionnaire    Fear of Current or Ex-Partner: No    Emotionally Abused: No    Physically Abused: No    Sexually Abused: No    FAMILY HISTORY Family History  Problem Relation Age of Onset   Cataracts Mother    Anesthesia problems Mother    Diabetes Father    Leukemia Father    Glaucoma Father    Breast cancer Sister    Lung cancer Maternal Aunt    Breast cancer Maternal Aunt    Breast cancer Maternal Aunt    Breast cancer Paternal Aunt    Macular degeneration Maternal Grandfather    Glaucoma Paternal Grandmother    Retinal detachment Paternal Grandfather     ALLERGIES:  is allergic to ciprofloxacin, methylprednisolone, penicillin g, sulfamethoxazole, tetracycline, yellow jacket venom, codeine, iodine, oxycodone, anastrozole, exemestane, and iohexol.  MEDICATIONS:  Current Outpatient Medications  Medication Sig Dispense Refill   ascorbic acid (VITAMIN C) 1000 MG tablet Take 1,500 mg by mouth daily. (Patient not taking: Reported on 04/06/2023)     calcium carbonate (SUPER CALCIUM) 1500 (600 Ca) MG TABS tablet Take 1 capsule by mouth daily. (Patient not taking: Reported on 04/06/2023)     cycloSPORINE (RESTASIS) 0.05 % ophthalmic emulsion 1 drop 2 (two) times daily. (Patient not taking: Reported on 04/06/2023)     LYRICA 100 MG capsule Take 100 mg by mouth daily. (Patient not taking: Reported on 01/04/2023)     Multiple Vitamin  (MULTIVITAMIN) capsule Take 1 capsule by mouth daily. (Patient not taking: Reported on 04/06/2023)     Omega-3 Fatty Acids (FISH  OIL) 1000 MG CAPS Take 1 capsule by mouth daily. (Patient not taking: Reported on 04/06/2023)     ondansetron (ZOFRAN-ODT) 4 MG disintegrating tablet Take 4 mg by mouth every 6 (six) hours as needed. (Patient not taking: Reported on 01/04/2023)     Probiotic Product (PROBIOTIC ADVANCED PO) Take by mouth. (Patient not taking: Reported on 04/06/2023)     Vitamin D-Vitamin K (D3 + K2 PO) Take 1 Capful by mouth daily at 12 noon. (Patient not taking: Reported on 04/06/2023)     No current facility-administered medications for this visit.    PHYSICAL EXAMINATION:  ECOG PERFORMANCE STATUS: 0 - Asymptomatic   There were no vitals filed for this visit.    There were no vitals filed for this visit.     Physical Exam Vitals and nursing note reviewed.  Constitutional:      Appearance: Normal appearance. She is not toxic-appearing or diaphoretic.     Comments: Here alone  HENT:     Head: Normocephalic and atraumatic.     Right Ear: External ear normal.     Left Ear: External ear normal.     Nose: Nose normal. No congestion or rhinorrhea.  Eyes:     General: No scleral icterus.    Extraocular Movements: Extraocular movements intact.     Conjunctiva/sclera: Conjunctivae normal.     Pupils: Pupils are equal, round, and reactive to light.  Cardiovascular:     Rate and Rhythm: Normal rate.     Heart sounds: No murmur heard.    No friction rub. No gallop.  Abdominal:     General: Bowel sounds are normal.     Palpations: Abdomen is soft.  Musculoskeletal:        General: No swelling, tenderness or deformity.     Cervical back: Normal range of motion and neck supple. No rigidity or tenderness.  Lymphadenopathy:     Head:     Right side of head: No submental, submandibular, tonsillar, preauricular, posterior auricular or occipital adenopathy.     Left side of head:  No submental, submandibular, tonsillar, preauricular, posterior auricular or occipital adenopathy.     Cervical: No cervical adenopathy.     Right cervical: No superficial, deep or posterior cervical adenopathy.    Left cervical: No superficial, deep or posterior cervical adenopathy.     Upper Body:     Right upper body: No supraclavicular, axillary, pectoral or epitrochlear adenopathy.     Left upper body: No supraclavicular, axillary, pectoral or epitrochlear adenopathy.  Skin:    General: Skin is warm.     Coloration: Skin is not jaundiced.  Neurological:     General: No focal deficit present.     Mental Status: She is alert and oriented to person, place, and time. Mental status is at baseline.     Cranial Nerves: No cranial nerve deficit.  Psychiatric:        Mood and Affect: Mood normal.        Behavior: Behavior normal.        Thought Content: Thought content normal.        Judgment: Judgment normal.      LABORATORY DATA: I have personally reviewed the data as listed:  No visits with results within 1 Month(s) from this visit.  Latest known visit with results is:  Appointment on 01/04/2023  Component Date Value Ref Range Status   Sodium 01/04/2023 139  135 - 145 mmol/L Final   Potassium 01/04/2023 3.8  3.5 -  5.1 mmol/L Final   Chloride 01/04/2023 105  98 - 111 mmol/L Final   CO2 01/04/2023 25  22 - 32 mmol/L Final   Glucose, Bld 01/04/2023 90  70 - 99 mg/dL Final   Glucose reference range applies only to samples taken after fasting for at least 8 hours.   BUN 01/04/2023 14  6 - 20 mg/dL Final   Creatinine, Ser 01/04/2023 0.83  0.44 - 1.00 mg/dL Final   Calcium 09/81/1914 9.4  8.9 - 10.3 mg/dL Final   Total Protein 78/29/5621 7.6  6.5 - 8.1 g/dL Final   Albumin 30/86/5784 3.7  3.5 - 5.0 g/dL Final   AST 69/62/9528 21  15 - 41 U/L Final   ALT 01/04/2023 30  0 - 44 U/L Final   Alkaline Phosphatase 01/04/2023 76  38 - 126 U/L Final   Total Bilirubin 01/04/2023 0.4  0.3 -  1.2 mg/dL Final   GFR, Estimated 01/04/2023 >60  >60 mL/min Final   Comment: (NOTE) Calculated using the CKD-EPI Creatinine Equation (2021)    Anion gap 01/04/2023 9  5 - 15 Final   Performed at [facility], [address] [place], [place] [address]   WBC 01/04/2023 5.8  4.0 - 10.5 K/uL Final   RBC 01/04/2023 5.01  3.87 - 5.11 MIL/uL Final   Hemoglobin 01/04/2023 14.5  12.0 - 15.0 g/dL Final   HCT 40/02/2724 44.4  36.0 - 46.0 % Final   MCV 01/04/2023 88.6  80.0 - 100.0 fL Final   MCH 01/04/2023 28.9  26.0 - 34.0 pg Final   MCHC 01/04/2023 32.7  30.0 - 36.0 g/dL Final   RDW 36/64/4034 14.3  11.5 - 15.5 % Final   Platelets 01/04/2023 298  150 - 400 K/uL Final   nRBC 01/04/2023 0.0  0.0 - 0.2 % Final   Neutrophils Relative % 01/04/2023 51  % Final   Neutro Abs 01/04/2023 3.0  1.7 - 7.7 K/uL Final   Lymphocytes Relative 01/04/2023 36  % Final   Lymphs Abs 01/04/2023 2.1  0.7 - 4.0 K/uL Final   Monocytes Relative 01/04/2023 9  % Final   Monocytes Absolute 01/04/2023 0.5  0.1 - 1.0 K/uL Final   Eosinophils Relative 01/04/2023 2  % Final   Eosinophils Absolute 01/04/2023 0.1  0.0 - 0.5 K/uL Final   Basophils Relative 01/04/2023 1  % Final   Basophils Absolute 01/04/2023 0.1  0.0 - 0.1 K/uL Final   Immature Granulocytes 01/04/2023 1  % Final   Abs Immature Granulocytes 01/04/2023 0.03  0.00 - 0.07 K/uL Final   Performed at [facility], [address], [place], [place] [address]    RADIOGRAPHIC STUDIES: I have personally reviewed the radiological images as listed and agree with the findings in the report  No results found.  ASSESSMENT/PLAN  63 y.o. female is here because of  DCIS.  History notable for GERD, hypertension, hyperglycemia, elevated TSH, disease, vitreous detachment both eyes   DCIS, right breast.  Grade 2 (ER positive, PR positive) May 25, 2017: Diagnostic right mammogram with U/S after called back  from screening.    Previously questioned mass in right breast persist on additional views U/S showed no focal abnormality in right breast 7:00 through 11 o'clock position.       November 23, 2017: Diagnostic right mammogram--subtle area of probable distortion in the upper outer right breast.  BI-RADS 4    December 05 2017:  Needle core bx right breast  lesion. Pathology - COMPLEX SCLEROSING LESION WITH USUAL DUCTAL HYPERPLASIA. FIBROCYTSTIC CHANGES WITH USUAL DUCTAL HYPERPLASIA.   December 26 2017: Lumpectomy right breast .  Focal DCIS intermediate nuclear grade.  DCIS < 2 mm from medial margin.  Residual complex sclerosing lesion with usual ductal hyperplasia and columnar cell change at an microcalcification adjacent to previous biopsy site.  Separate 2 mm complex sclerosing lesion  IHC Estrogen positive/Progesterone negative.    January 23 2018:  Re-excision.  Pathology- No evidence of malignancy or atypia   February 20 2018:  Began adjuvant XRT to right breast    April 02 2018:  Began Anastrazole Oct 17 2018:  Discontinued 08/17/2018 due to nausea and emesis.  Started Exemestane 25 mg daily but couldn't tolerate due to nausea/emesis April  2020:  Declined further endocrine therapy. Concerned about side effects.    October 29 2020:  Bilateral Diagnostic Mammogram.  BIRADS 2  [employer]  January 04 2023- Referred for screening mammogram.  Patient did not undergo mammogram in 2023 due to loss of insurance  January 12 2023:  Bilateral screening mammogram BI-RADS CATEGORY  1: Negative.  April 06 2023- Refer for DEXA as she is at risk for osteoporosis  Abnormal vaginal bleeding   March 22 2023:  [place] as follows: 9 x 3 x 3 cm with an endometrial thickness of 10 mm with mild cystic changes. EMB w/ proliferative endometrium no hyperplasia or malignancy.        Cancer Staging  No matching staging information was found for the patient.    No problem-specific Assessment & Plan notes found for this encounter.    No  orders of the defined types were placed in this encounter.   30  minutes was spent in patient care.  This included time spent preparing to see the patient (e.g., review of tests), obtaining and/or reviewing separately obtained history, counseling and educating the patient/family/caregiver, ordering medications, tests, or procedures; documenting clinical information in the electronic or other health record, independently interpreting results and communicating results to the patient/family/caregiver as well as coordination of care.       All questions were answered. The patient knows to call the clinic with any problems, questions or concerns.  This note was electronically signed.    Weston Settle, MD  08/03/2023 5:30 PM

## 2023-08-04 ENCOUNTER — Inpatient Hospital Stay: Payer: Medicaid Other | Attending: Oncology | Admitting: Oncology

## 2023-08-04 ENCOUNTER — Telehealth: Payer: Self-pay | Admitting: Oncology

## 2023-08-04 ENCOUNTER — Ambulatory Visit: Payer: Medicaid Other | Admitting: Oncology

## 2023-08-04 ENCOUNTER — Other Ambulatory Visit: Payer: Self-pay | Admitting: Oncology

## 2023-08-04 VITALS — BP 136/81 | HR 87 | Temp 98.8°F | Resp 18 | Ht 65.0 in | Wt 217.7 lb

## 2023-08-04 DIAGNOSIS — D0511 Intraductal carcinoma in situ of right breast: Secondary | ICD-10-CM

## 2023-08-04 DIAGNOSIS — Z801 Family history of malignant neoplasm of trachea, bronchus and lung: Secondary | ICD-10-CM | POA: Diagnosis not present

## 2023-08-04 DIAGNOSIS — Z86000 Personal history of in-situ neoplasm of breast: Secondary | ICD-10-CM | POA: Diagnosis present

## 2023-08-04 DIAGNOSIS — Z79899 Other long term (current) drug therapy: Secondary | ICD-10-CM | POA: Diagnosis not present

## 2023-08-04 DIAGNOSIS — D0510 Intraductal carcinoma in situ of unspecified breast: Secondary | ICD-10-CM

## 2023-08-04 DIAGNOSIS — Z803 Family history of malignant neoplasm of breast: Secondary | ICD-10-CM | POA: Diagnosis not present

## 2023-08-04 NOTE — Progress Notes (Signed)
 [facility]  [address] [place],  [place]  [address] [phone]  Clinic Day:  08/04/2023  Referring physician: Leane Call, PA-C   HISTORY OF PRESENT ILLNESS:  The patient is a 63 y.o. female with hormone positive DCIS in her right breast, status post a lumpectomy in July 2019.  She tried both anastrozole and exemestane in the chemopreventive setting, but tolerated both agents poorly.  She ultimately elected not to try additional agents for chemoprevention.  Fortunately, studies since then have not shown any evidence of disease recurrence.  She comes in for routine follow-up today.  Since his last visit, the patient has been doing well.  She denies having any particular changes in her breasts which concern her for disease recurrence.  Of note, her last mammogram in August 2024 showed no evidence of disease recurrence.    PHYSICAL EXAM:  Blood pressure 136/81, pulse 87, temperature 98.8 F (37.1 C), temperature source Oral, resp. rate 18, height 5\' 5"  (1.651 m), weight 217 lb 11.2 oz (98.7 kg), SpO2 94%. Wt Readings from Last 3 Encounters:  08/04/23 217 lb 11.2 oz (98.7 kg)  04/06/23 218 lb (98.9 kg)  01/04/23 221 lb 8 oz (100.5 kg)   Body mass index is 36.23 kg/m. Performance status (ECOG): 0 - Asymptomatic Physical Exam Constitutional:      Appearance: Normal appearance.  HENT:     Mouth/Throat:     Pharynx: Oropharynx is clear. No oropharyngeal exudate.  Cardiovascular:     Rate and Rhythm: Normal rate and regular rhythm.     Heart sounds: No murmur heard.    No friction rub. No gallop.  Pulmonary:     Breath sounds: Normal breath sounds.  Chest:  Breasts:    Right: No swelling, bleeding, inverted nipple, mass, nipple discharge or skin change.     Left: No swelling, bleeding, inverted nipple, mass, nipple discharge or skin change.  Abdominal:     General: Bowel sounds are normal. There is no distension.     Palpations: Abdomen  is soft. There is no mass.     Tenderness: There is no abdominal tenderness.  Musculoskeletal:        General: No tenderness.     Cervical back: Normal range of motion and neck supple.     Right lower leg: No edema.     Left lower leg: No edema.  Lymphadenopathy:     Cervical: No cervical adenopathy.     Right cervical: No superficial, deep or posterior cervical adenopathy.    Left cervical: No superficial, deep or posterior cervical adenopathy.     Upper Body:     Right upper body: No supraclavicular or axillary adenopathy.     Left upper body: No supraclavicular or axillary adenopathy.     Lower Body: No right inguinal adenopathy. No left inguinal adenopathy.  Skin:    Coloration: Skin is not jaundiced.     Findings: No lesion or rash.  Neurological:     General: No focal deficit present.     Mental Status: She is alert and oriented to person, place, and time. Mental status is at baseline.  Psychiatric:        Mood and Affect: Mood normal.        Behavior: Behavior normal.        Thought Content: Thought content normal.        Judgment: Judgment normal.    LABS:      Latest Ref  Rng & Units 01/04/2023   12:10 PM  CBC  WBC 4.0 - 10.5 K/uL 5.8   Hemoglobin 12.0 - 15.0 g/dL 40.9   Hematocrit 81.1 - 46.0 % 44.4   Platelets 150 - 400 K/uL 298       Latest Ref Rng & Units 01/04/2023   12:10 PM  CMP  Glucose 70 - 99 mg/dL 90   BUN 6 - 20 mg/dL 14   Creatinine 9.14 - 1.00 mg/dL 7.82   Sodium 956 - 213 mmol/L 139   Potassium 3.5 - 5.1 mmol/L 3.8   Chloride 98 - 111 mmol/L 105   CO2 22 - 32 mmol/L 25   Calcium 8.9 - 10.3 mg/dL 9.4   Total Protein 6.5 - 8.1 g/dL 7.6   Total Bilirubin 0.3 - 1.2 mg/dL 0.4   Alkaline Phos 38 - 126 U/L 76   AST 15 - 41 U/L 21   ALT 0 - 44 U/L 30    ASSESSMENT & PLAN:  Assessment/Plan:  A 63 y.o. female with right-sided DCIS, status post a lumpectomy in July 2019.  Based upon her clinical breast exam today, the patient remains disease free.   Clinically, the patient continues to do very well.  Moving forward, I will perform clinical breast exams on a yearly basis.  I will ensure her annual mammogram is done in August 2025 for her continued radiographic breast cancer surveillance.  The patient understands all the plans discussed today and is in agreement with them.    Susan Ware Kirby Funk, MD

## 2023-08-04 NOTE — Telephone Encounter (Signed)
 08/04/23 Spoke with patient and confimed next appt.

## 2024-01-15 ENCOUNTER — Inpatient Hospital Stay (HOSPITAL_BASED_OUTPATIENT_CLINIC_OR_DEPARTMENT_OTHER): Admission: RE | Admit: 2024-01-15 | Source: Ambulatory Visit | Admitting: Radiology

## 2024-01-23 ENCOUNTER — Ambulatory Visit (INDEPENDENT_AMBULATORY_CARE_PROVIDER_SITE_OTHER)
Admission: RE | Admit: 2024-01-23 | Discharge: 2024-01-23 | Disposition: A | Discharge status: Home / Self Care | Source: Ambulatory Visit | Attending: Oncology | Admitting: Oncology

## 2024-01-23 DIAGNOSIS — D0510 Intraductal carcinoma in situ of unspecified breast: Secondary | ICD-10-CM

## 2024-08-02 ENCOUNTER — Ambulatory Visit: Admitting: Oncology
